# Patient Record
Sex: Female | Born: 1985 | Race: Black or African American | Hispanic: No | Marital: Single | Smoking: Never smoker
Health system: Southern US, Community
[De-identification: ages and names within clinical notes are randomized; demographics above are authoritative.]

---

## 2016-11-11 ENCOUNTER — Encounter: Payer: Self-pay | Admitting: Emergency Medicine

## 2016-11-11 ENCOUNTER — Emergency Department: Payer: Medicaid Other

## 2016-11-11 ENCOUNTER — Emergency Department
Admission: EM | Admit: 2016-11-11 | Discharge: 2016-11-11 | Disposition: A | Discharge status: Home / Self Care | Payer: Medicaid Other | Attending: Emergency Medicine | Admitting: Emergency Medicine

## 2016-11-11 DIAGNOSIS — T7491XA Unspecified adult maltreatment, confirmed, initial encounter: Secondary | ICD-10-CM

## 2016-11-11 DIAGNOSIS — Y999 Unspecified external cause status: Secondary | ICD-10-CM | POA: Diagnosis not present

## 2016-11-11 DIAGNOSIS — Y939 Activity, unspecified: Secondary | ICD-10-CM | POA: Diagnosis not present

## 2016-11-11 DIAGNOSIS — Y929 Unspecified place or not applicable: Secondary | ICD-10-CM | POA: Diagnosis not present

## 2016-11-11 DIAGNOSIS — M549 Dorsalgia, unspecified: Secondary | ICD-10-CM | POA: Insufficient documentation

## 2016-11-11 DIAGNOSIS — S0990XA Unspecified injury of head, initial encounter: Secondary | ICD-10-CM | POA: Insufficient documentation

## 2016-11-11 LAB — CBC WITH DIFFERENTIAL/PLATELET
BASOS ABS: 0 10*3/uL (ref 0–0.1)
Basophils Relative: 0 %
EOS PCT: 1 %
Eosinophils Absolute: 0.1 10*3/uL (ref 0–0.7)
HCT: 31.8 % — ABNORMAL LOW (ref 35.0–47.0)
Hemoglobin: 11 g/dL — ABNORMAL LOW (ref 12.0–16.0)
LYMPHS ABS: 1.6 10*3/uL (ref 1.0–3.6)
LYMPHS PCT: 17 %
MCH: 28.8 pg (ref 26.0–34.0)
MCHC: 34.7 g/dL (ref 32.0–36.0)
MCV: 83.1 fL (ref 80.0–100.0)
MONO ABS: 0.7 10*3/uL (ref 0.2–0.9)
MONOS PCT: 8 %
Neutro Abs: 7 10*3/uL — ABNORMAL HIGH (ref 1.4–6.5)
Neutrophils Relative %: 74 %
PLATELETS: 198 10*3/uL (ref 150–440)
RBC: 3.82 MIL/uL (ref 3.80–5.20)
RDW: 13.8 % (ref 11.5–14.5)
WBC: 9.4 10*3/uL (ref 3.6–11.0)

## 2016-11-11 LAB — COMPREHENSIVE METABOLIC PANEL
ALK PHOS: 57 U/L (ref 38–126)
ALT: 10 U/L — ABNORMAL LOW (ref 14–54)
AST: 36 U/L (ref 15–41)
Albumin: 3.3 g/dL — ABNORMAL LOW (ref 3.5–5.0)
Anion gap: 7 (ref 5–15)
BILIRUBIN TOTAL: 0.2 mg/dL — AB (ref 0.3–1.2)
CO2: 22 mmol/L (ref 22–32)
Calcium: 8.6 mg/dL — ABNORMAL LOW (ref 8.9–10.3)
Chloride: 108 mmol/L (ref 101–111)
Creatinine, Ser: 0.37 mg/dL — ABNORMAL LOW (ref 0.44–1.00)
GFR calc Af Amer: >60 mL/min (ref >60)
GFR calc non Af Amer: >60 mL/min (ref >60)
GLUCOSE: 78 mg/dL (ref 65–99)
POTASSIUM: 2.9 mmol/L — AB (ref 3.5–5.1)
Sodium: 137 mmol/L (ref 135–145)
TOTAL PROTEIN: 6.4 g/dL — AB (ref 6.5–8.1)

## 2016-11-11 LAB — POCT PREGNANCY, URINE: Preg Test, Ur: POSITIVE — AB

## 2016-11-11 MED ORDER — OXYCODONE HCL 5 MG PO TABS: 5.0000 mg | ORAL_TABLET | Freq: Three times a day (TID) | ORAL | 0 refills | Status: AC | PRN

## 2016-11-11 MED ORDER — IOPAMIDOL (ISOVUE-370) INJECTION 76%
75.0000 mL | Freq: Once | INTRAVENOUS | Status: AC | PRN
  Administered 2016-11-11: 75 mL via INTRAVENOUS

## 2016-11-11 MED ORDER — FENTANYL CITRATE (PF) 100 MCG/2ML IJ SOLN
50.0000 ug | Freq: Once | INTRAMUSCULAR | Status: AC
  Administered 2016-11-11: 50 ug via INTRAVENOUS
  Filled 2016-11-11: qty 2

## 2016-11-11 NOTE — ED Notes (Signed)
Left message for social worker; she called back and will come in about 30 minutes.

## 2016-11-11 NOTE — ED Notes (Signed)
Pt mother at bedside per pt request

## 2016-11-11 NOTE — ED Notes (Signed)
ED Provider at bedside. 
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
## 2016-11-11 NOTE — ED Notes (Signed)
Pt discharged home after verbalizing understanding of discharge instructions; nad noted. 

## 2016-11-11 NOTE — ED Notes (Signed)
Social worker at bedside.
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
